# Patient Record
Sex: Female | Born: 1935 | Race: White | Hispanic: No
Health system: Southern US, Community
[De-identification: ages and names within clinical notes are randomized; demographics above are authoritative.]

---

## 2004-12-23 ENCOUNTER — Emergency Department: Payer: Self-pay | Admitting: Emergency Medicine

## 2005-02-25 ENCOUNTER — Encounter: Payer: Self-pay | Admitting: Cardiology

## 2005-03-07 ENCOUNTER — Observation Stay: Payer: Self-pay | Admitting: Internal Medicine

## 2005-03-07 ENCOUNTER — Other Ambulatory Visit: Payer: Self-pay

## 2005-03-22 ENCOUNTER — Encounter: Payer: Self-pay | Admitting: Cardiology

## 2005-04-22 ENCOUNTER — Encounter: Payer: Self-pay | Admitting: Cardiology

## 2005-05-23 ENCOUNTER — Encounter: Payer: Self-pay | Admitting: Cardiology

## 2005-06-23 ENCOUNTER — Encounter: Payer: Self-pay | Admitting: Internal Medicine

## 2005-07-08 ENCOUNTER — Ambulatory Visit: Payer: Self-pay | Admitting: Internal Medicine

## 2005-07-23 ENCOUNTER — Encounter: Payer: Self-pay | Admitting: Internal Medicine

## 2005-08-22 ENCOUNTER — Encounter: Payer: Self-pay | Admitting: Internal Medicine

## 2005-09-22 ENCOUNTER — Encounter: Payer: Self-pay | Admitting: Internal Medicine

## 2005-10-23 ENCOUNTER — Encounter: Payer: Self-pay | Admitting: Internal Medicine

## 2005-11-20 ENCOUNTER — Encounter: Payer: Self-pay | Admitting: Internal Medicine

## 2005-12-21 ENCOUNTER — Encounter: Payer: Self-pay | Admitting: Internal Medicine

## 2006-01-20 ENCOUNTER — Encounter: Payer: Self-pay | Admitting: Internal Medicine

## 2006-02-20 ENCOUNTER — Encounter: Payer: Self-pay | Admitting: Internal Medicine

## 2006-03-24 ENCOUNTER — Encounter: Payer: Self-pay | Admitting: Internal Medicine

## 2006-04-11 ENCOUNTER — Other Ambulatory Visit: Payer: Self-pay

## 2006-04-11 ENCOUNTER — Emergency Department: Payer: Self-pay | Admitting: Emergency Medicine

## 2006-04-22 ENCOUNTER — Encounter: Payer: Self-pay | Admitting: Internal Medicine

## 2006-05-23 ENCOUNTER — Encounter: Payer: Self-pay | Admitting: Internal Medicine

## 2006-06-22 ENCOUNTER — Encounter: Payer: Self-pay | Admitting: Internal Medicine

## 2006-07-23 ENCOUNTER — Encounter: Payer: Self-pay | Admitting: Internal Medicine

## 2006-08-22 ENCOUNTER — Encounter: Payer: Self-pay | Admitting: Internal Medicine

## 2006-09-22 ENCOUNTER — Encounter: Payer: Self-pay | Admitting: Internal Medicine

## 2006-10-23 ENCOUNTER — Encounter: Payer: Self-pay | Admitting: Internal Medicine

## 2006-11-21 ENCOUNTER — Encounter: Payer: Self-pay | Admitting: Internal Medicine

## 2006-12-22 ENCOUNTER — Encounter: Payer: Self-pay | Admitting: Internal Medicine

## 2007-01-21 ENCOUNTER — Encounter: Payer: Self-pay | Admitting: Internal Medicine

## 2007-02-21 ENCOUNTER — Encounter: Payer: Self-pay | Admitting: Internal Medicine

## 2007-03-23 ENCOUNTER — Encounter: Payer: Self-pay | Admitting: Internal Medicine

## 2007-04-23 ENCOUNTER — Encounter: Payer: Self-pay | Admitting: Internal Medicine

## 2007-05-24 ENCOUNTER — Encounter: Payer: Self-pay | Admitting: Internal Medicine

## 2007-09-09 ENCOUNTER — Ambulatory Visit: Payer: Self-pay | Admitting: Internal Medicine

## 2008-04-25 ENCOUNTER — Ambulatory Visit: Payer: Self-pay

## 2008-11-17 ENCOUNTER — Emergency Department: Payer: Self-pay | Admitting: Emergency Medicine

## 2009-07-13 ENCOUNTER — Emergency Department: Payer: Self-pay | Admitting: Unknown Physician Specialty

## 2010-01-18 ENCOUNTER — Inpatient Hospital Stay: Payer: Self-pay | Admitting: Internal Medicine

## 2010-01-30 ENCOUNTER — Inpatient Hospital Stay: Payer: Self-pay | Admitting: Internal Medicine

## 2010-12-25 IMAGING — CR DG CHEST 1V PORT
1 series · 1 of 1 positions shown · non-contrast
Comparison: none

REASON FOR EXAM: FEVER
COMMENTS:

[view not recorded]
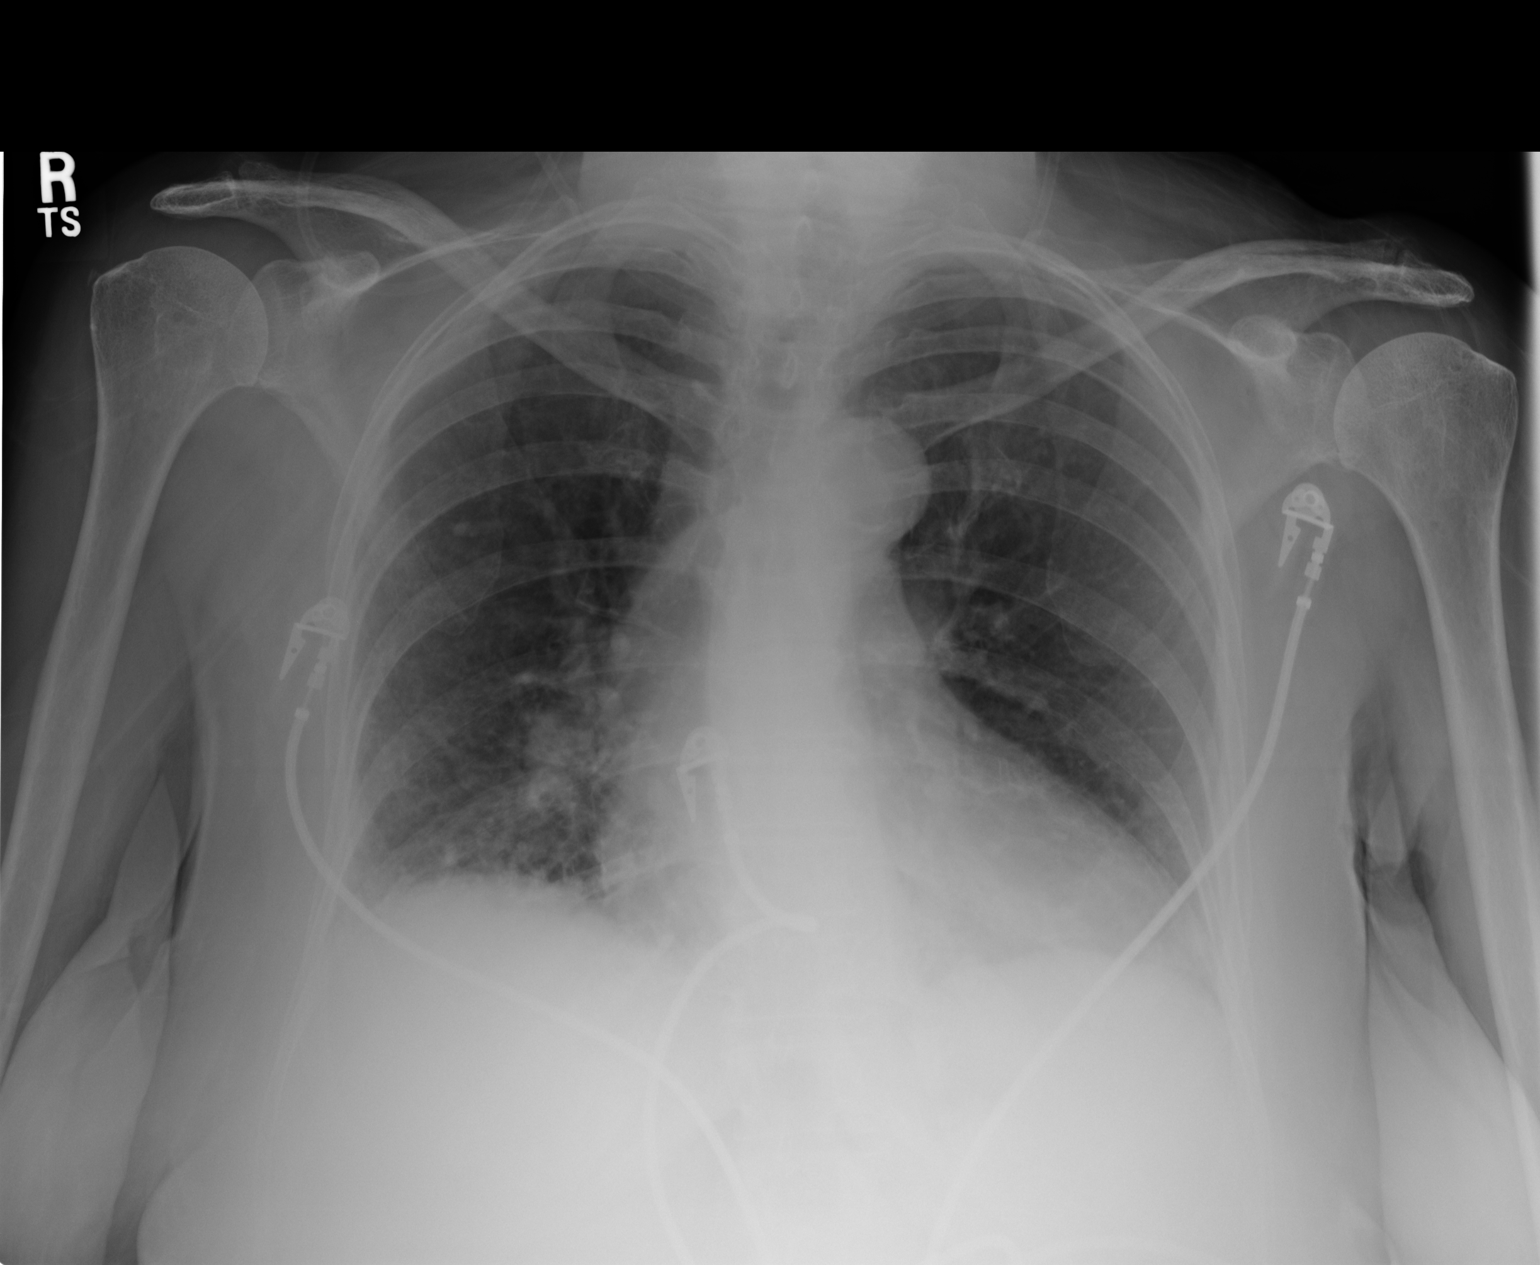

[1 of 1 positions shown; findings below may reference images not displayed]

PROCEDURE:     DXR - DXR PORTABLE CHEST SINGLE VIEW  - January 30, 2010  [DATE]

RESULT:     Frontal view of the chest is performed. Comparison is made to a
prior study dated 01/18/2010.

There is thickening of the interstitial markings and peribronchial cuffing.
An area of mild increased density projects within the right lung base. No
focal regions of consolidation are appreciated. The cardiac silhouette is
within the upper limits of normal. The visualized bony skeleton is
unremarkable.
IMPRESSION: Interstitial infiltrate, edematous versus nonedematous, with
likely a component of underlying pulmonary fibrosis.

## 2010-12-26 IMAGING — US US EXTREM LOW VENOUS BILAT
1 series · 17 of 24 positions shown · non-contrast
Comparison: none

REASON FOR EXAM: ro dvt
COMMENTS:

[Series 1: us extrem low venous bilat · 17 of 40 slices shown]
[im 1/40]
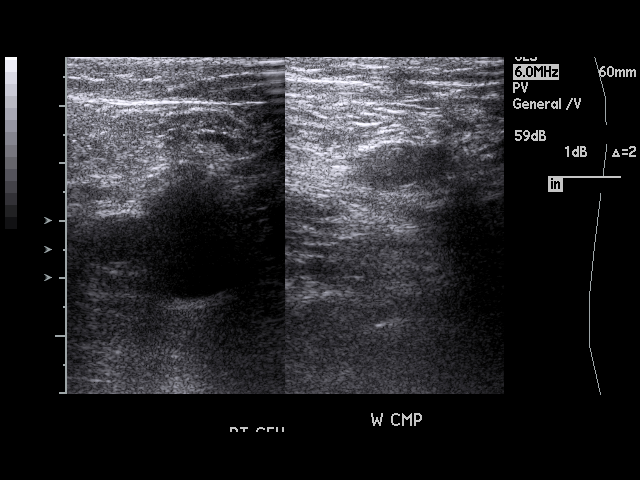
[im 4/40]
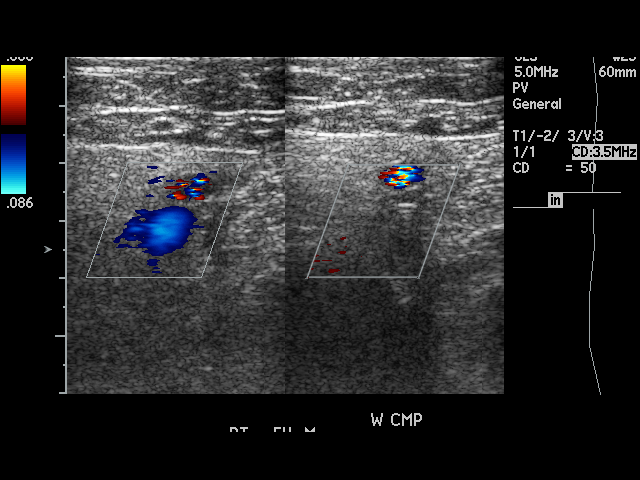
[im 6/40]
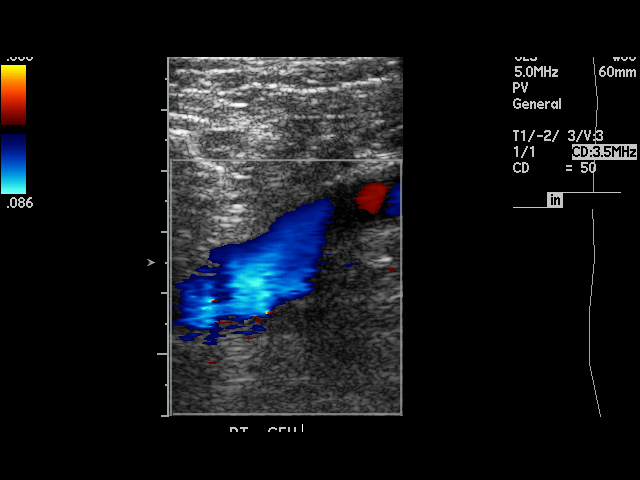
[im 7/40]
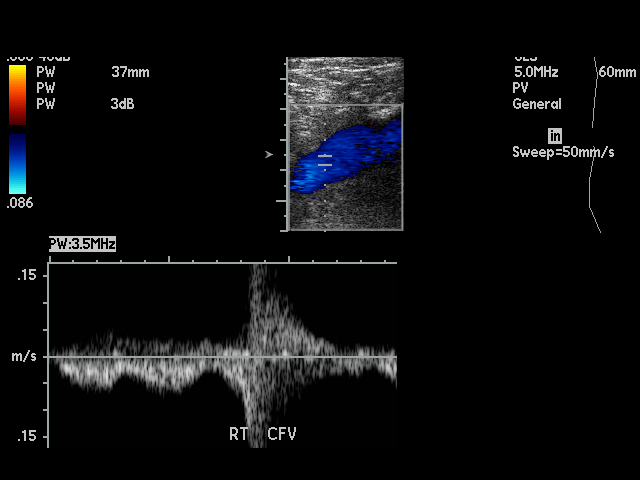
[im 11/40]
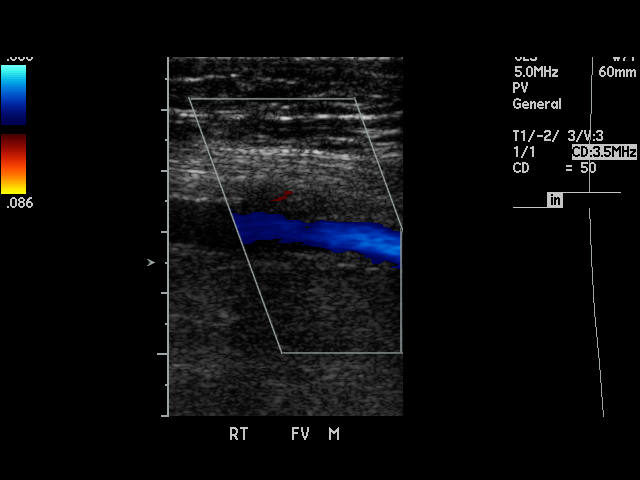
[im 12/40]
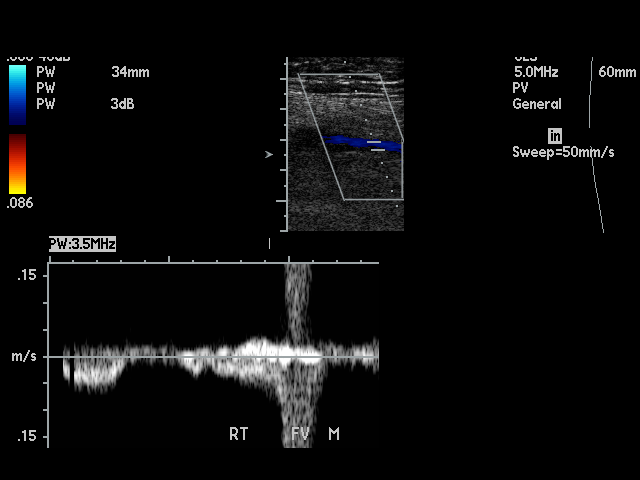
[im 16/40]
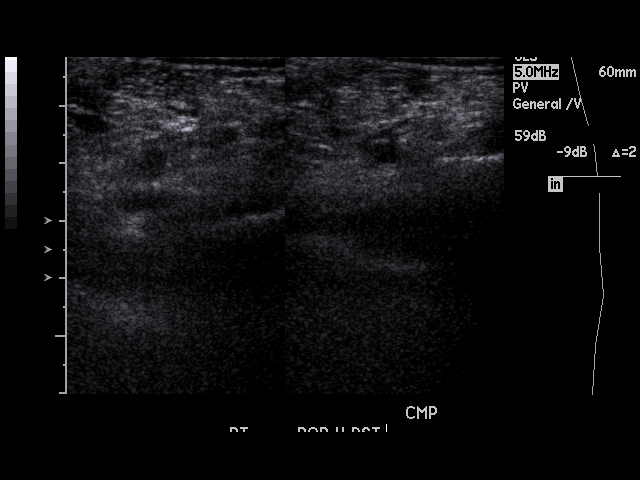
[im 17/40]
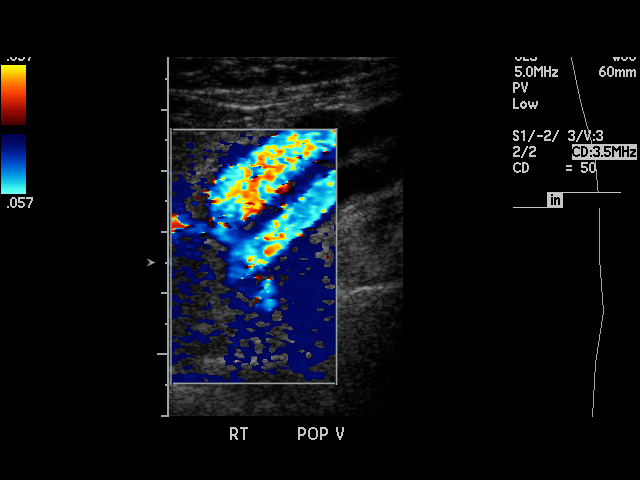
[im 21/40]
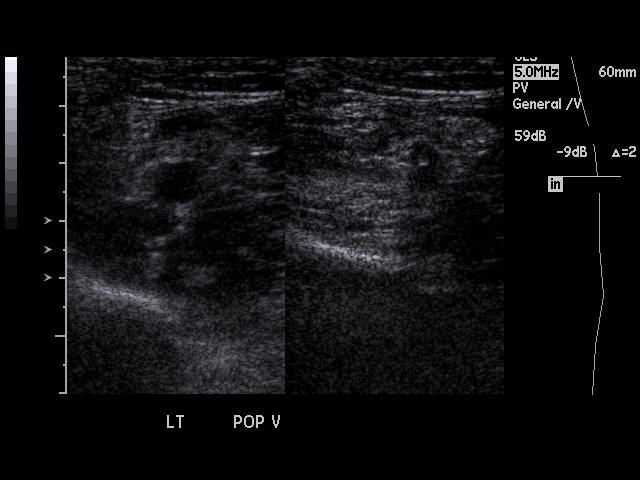
[im 23/40]
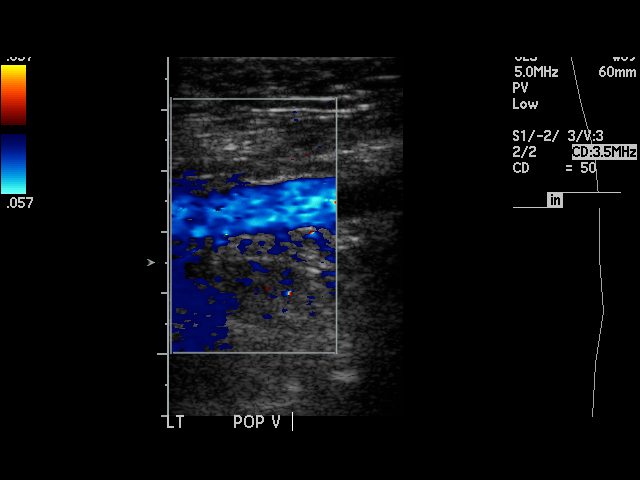
[im 24/40]
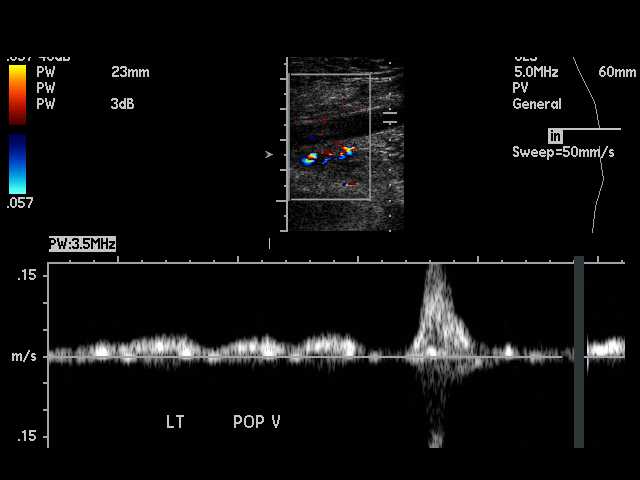
[im 28/40]
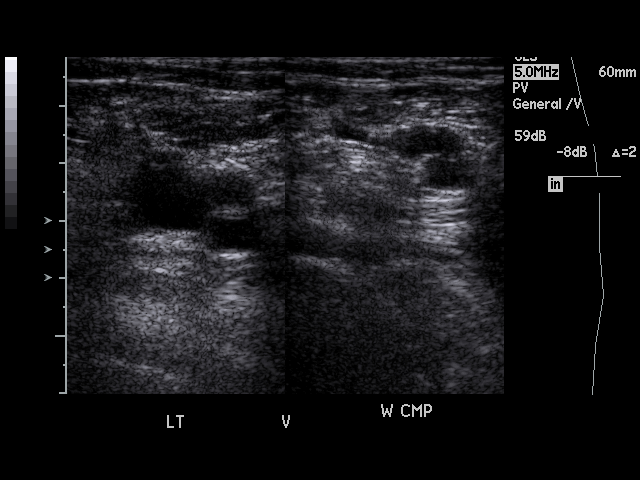
[im 29/40]
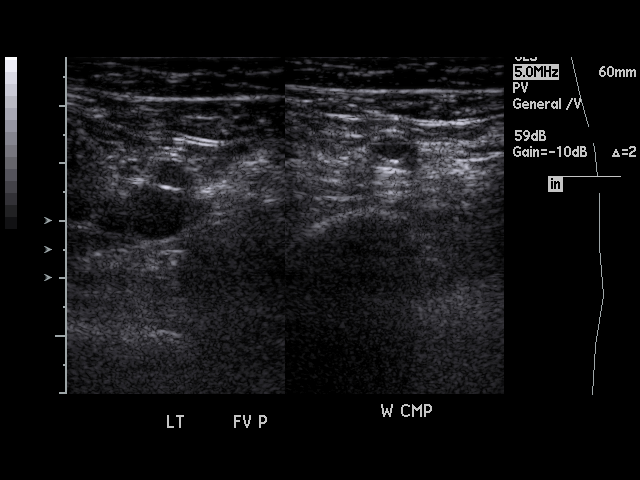
[im 33/40]
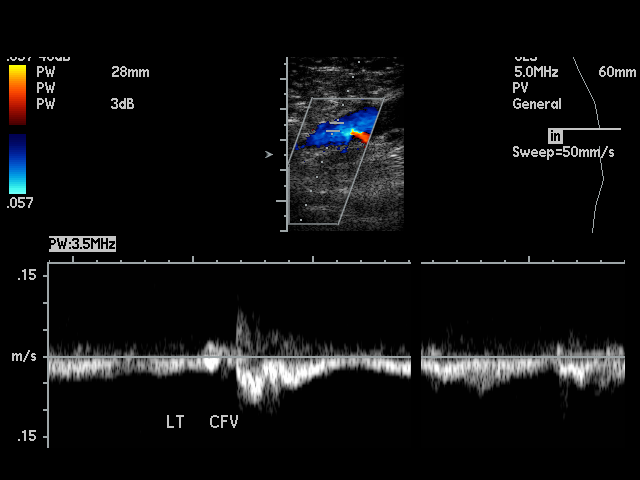
[im 34/40]
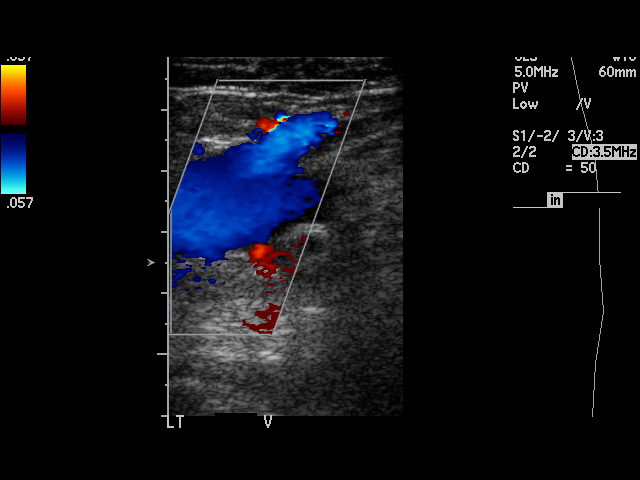
[im 36/40]
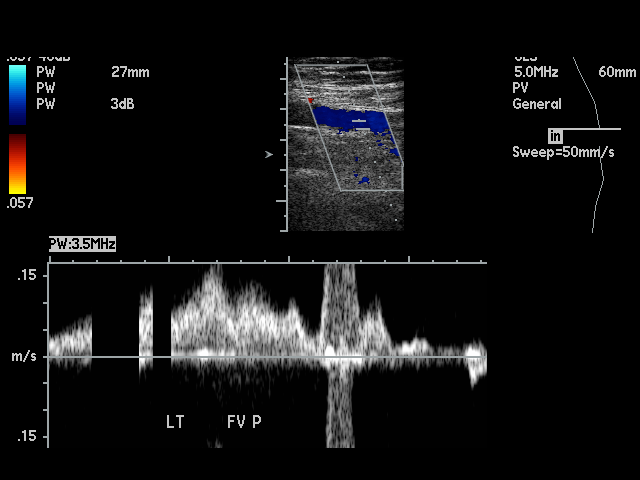
[im 40/40]
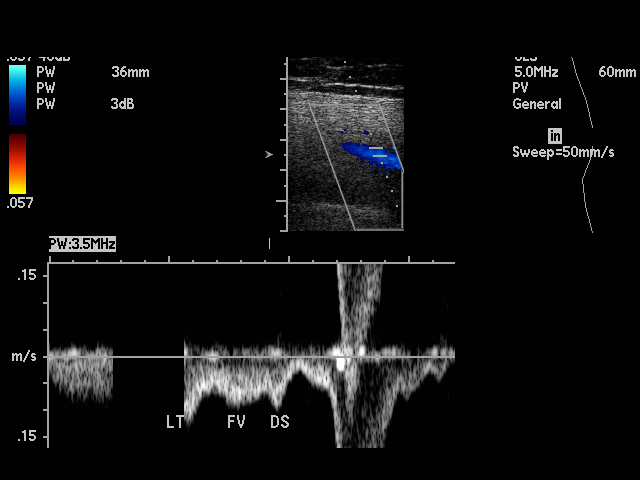

[17 of 24 positions shown; findings below may reference images not displayed]

PROCEDURE:     US  - US DOPPLER LOW EXTR BILATERAL  - January 31, 2010 [DATE]

RESULT:     The phasic, augmentation and Valsalva waveforms are normal in
appearance bilaterally. The femoral and popliteal veins bilaterally show
complete compressibility. Doppler examination shows no occlusion or evidence
of deep vein thrombosis.
IMPRESSION: No deep vein thrombosis is identified in either leg.

## 2011-11-01 ENCOUNTER — Emergency Department: Payer: Self-pay | Admitting: Internal Medicine

## 2012-04-21 ENCOUNTER — Emergency Department: Payer: Self-pay | Admitting: Emergency Medicine

## 2012-04-21 LAB — CBC
HCT: 33.1 % — ABNORMAL LOW (ref 35.0–47.0)
MCHC: 34.1 g/dL (ref 32.0–36.0)
Platelet: 173 10*3/uL (ref 150–440)
RDW: 13.9 % (ref 11.5–14.5)
WBC: 5.1 10*3/uL (ref 3.6–11.0)

## 2012-04-21 LAB — URINALYSIS, COMPLETE
Bilirubin,UR: NEGATIVE
Blood: NEGATIVE
Ketone: NEGATIVE
Ph: 5 (ref 4.5–8.0)
RBC,UR: 2 /HPF (ref 0–5)
Specific Gravity: 1.021 (ref 1.003–1.030)
Squamous Epithelial: <1
WBC UR: 86 /HPF (ref 0–5)

## 2012-04-21 LAB — COMPREHENSIVE METABOLIC PANEL
Albumin: 3 g/dL — ABNORMAL LOW (ref 3.4–5.0)
Alkaline Phosphatase: 101 U/L (ref 50–136)
BUN: 17 mg/dL (ref 7–18)
Calcium, Total: 8.4 mg/dL — ABNORMAL LOW (ref 8.5–10.1)
Co2: 24 mmol/L (ref 21–32)
EGFR (African American): >60
EGFR (Non-African Amer.): >60
Glucose: 112 mg/dL — ABNORMAL HIGH (ref 65–99)
Osmolality: 284 (ref 275–301)
Potassium: 4.2 mmol/L (ref 3.5–5.1)
SGPT (ALT): 13 U/L

## 2012-04-21 LAB — TROPONIN I: Troponin-I: <0.02 ng/mL

## 2012-12-21 ENCOUNTER — Ambulatory Visit: Payer: Self-pay | Admitting: Internal Medicine

## 2012-12-29 ENCOUNTER — Inpatient Hospital Stay: Payer: Self-pay | Admitting: Internal Medicine

## 2012-12-29 LAB — CBC
HCT: 35.7 % (ref 35.0–47.0)
HGB: 11.9 g/dL — ABNORMAL LOW (ref 12.0–16.0)
MCH: 30.2 pg (ref 26.0–34.0)
MCHC: 33.4 g/dL (ref 32.0–36.0)
MCV: 91 fL (ref 80–100)
Platelet: 147 10*3/uL — ABNORMAL LOW (ref 150–440)
RBC: 3.94 10*6/uL (ref 3.80–5.20)
RDW: 13.4 % (ref 11.5–14.5)
WBC: 7.6 10*3/uL (ref 3.6–11.0)

## 2012-12-29 LAB — COMPREHENSIVE METABOLIC PANEL
Albumin: 3.5 g/dL (ref 3.4–5.0)
Alkaline Phosphatase: 96 U/L (ref 50–136)
BUN: 17 mg/dL (ref 7–18)
Bilirubin,Total: 0.4 mg/dL (ref 0.2–1.0)
Chloride: 108 mmol/L — ABNORMAL HIGH (ref 98–107)
Creatinine: 0.88 mg/dL (ref 0.60–1.30)
EGFR (Non-African Amer.): 58 — ABNORMAL LOW
Potassium: 4.3 mmol/L (ref 3.5–5.1)
SGOT(AST): 17 U/L (ref 15–37)
Sodium: 141 mmol/L (ref 136–145)

## 2012-12-29 LAB — URINALYSIS, COMPLETE
Glucose,UR: NEGATIVE mg/dL (ref 0–75)
Hyaline Cast: 74
Ketone: NEGATIVE
Squamous Epithelial: 20

## 2012-12-29 LAB — TROPONIN I: Troponin-I: <0.02 ng/mL

## 2012-12-29 LAB — CK TOTAL AND CKMB (NOT AT ARMC): CK-MB: <0.5 ng/mL — ABNORMAL LOW (ref 0.5–3.6)

## 2012-12-30 LAB — CBC WITH DIFFERENTIAL/PLATELET
Basophil #: 0 10*3/uL (ref 0.0–0.1)
Basophil %: 0.4 %
Eosinophil #: 0.1 10*3/uL (ref 0.0–0.7)
Eosinophil %: 1.3 %
HCT: 31.2 % — ABNORMAL LOW (ref 35.0–47.0)
MCV: 89 fL (ref 80–100)
Monocyte #: 0.6 x10 3/mm (ref 0.2–0.9)
Neutrophil %: 64.9 %
Platelet: 125 10*3/uL — ABNORMAL LOW (ref 150–440)
RBC: 3.51 10*6/uL — ABNORMAL LOW (ref 3.80–5.20)

## 2012-12-30 LAB — BASIC METABOLIC PANEL
Calcium, Total: 8.1 mg/dL — ABNORMAL LOW (ref 8.5–10.1)
Chloride: 109 mmol/L — ABNORMAL HIGH (ref 98–107)
Creatinine: 0.87 mg/dL (ref 0.60–1.30)
EGFR (Non-African Amer.): 59 — ABNORMAL LOW
Glucose: 94 mg/dL (ref 65–99)
Osmolality: 281 (ref 275–301)
Potassium: 3.7 mmol/L (ref 3.5–5.1)
Sodium: 141 mmol/L (ref 136–145)

## 2012-12-30 LAB — MAGNESIUM: Magnesium: 1.5 mg/dL — ABNORMAL LOW

## 2013-01-01 LAB — CBC WITH DIFFERENTIAL/PLATELET
Basophil #: 0 10*3/uL (ref 0.0–0.1)
Eosinophil #: 0.1 10*3/uL (ref 0.0–0.7)
HCT: 34.5 % — ABNORMAL LOW (ref 35.0–47.0)
Lymphocyte %: 22.9 %
MCH: 29.8 pg (ref 26.0–34.0)
MCHC: 33.6 g/dL (ref 32.0–36.0)
MCV: 89 fL (ref 80–100)
Monocyte #: 0.6 x10 3/mm (ref 0.2–0.9)
Neutrophil #: 4.4 10*3/uL (ref 1.4–6.5)
Neutrophil %: 65.5 %
Platelet: 143 10*3/uL — ABNORMAL LOW (ref 150–440)
RDW: 13.3 % (ref 11.5–14.5)

## 2013-01-02 LAB — CBC WITH DIFFERENTIAL/PLATELET
Basophil #: 0 10*3/uL (ref 0.0–0.1)
Basophil %: 0.6 %
Eosinophil #: 0.2 10*3/uL (ref 0.0–0.7)
HGB: 10.5 g/dL — ABNORMAL LOW (ref 12.0–16.0)
Lymphocyte #: 1.2 10*3/uL (ref 1.0–3.6)
Lymphocyte %: 23.1 %
MCH: 29.4 pg (ref 26.0–34.0)
MCHC: 33.1 g/dL (ref 32.0–36.0)
Monocyte #: 0.5 x10 3/mm (ref 0.2–0.9)
Monocyte %: 9.8 %
Neutrophil #: 3.3 10*3/uL (ref 1.4–6.5)
Neutrophil %: 63 %
WBC: 5.2 10*3/uL (ref 3.6–11.0)

## 2013-01-02 LAB — BASIC METABOLIC PANEL
BUN: 9 mg/dL (ref 7–18)
Calcium, Total: 8 mg/dL — ABNORMAL LOW (ref 8.5–10.1)
Chloride: 111 mmol/L — ABNORMAL HIGH (ref 98–107)
Co2: 26 mmol/L (ref 21–32)
Glucose: 106 mg/dL — ABNORMAL HIGH (ref 65–99)
Osmolality: 282 (ref 275–301)

## 2013-01-03 LAB — BASIC METABOLIC PANEL
BUN: 7 mg/dL (ref 7–18)
Calcium, Total: 7.6 mg/dL — ABNORMAL LOW (ref 8.5–10.1)
Chloride: 114 mmol/L — ABNORMAL HIGH (ref 98–107)
Co2: 25 mmol/L (ref 21–32)
Creatinine: 0.63 mg/dL (ref 0.60–1.30)
EGFR (African American): >60
Glucose: 98 mg/dL (ref 65–99)
Osmolality: 285 (ref 275–301)
Potassium: 3.4 mmol/L — ABNORMAL LOW (ref 3.5–5.1)

## 2013-01-03 LAB — WBCS, STOOL

## 2013-01-03 LAB — VANCOMYCIN, TROUGH: Vancomycin, Trough: 6 ug/mL — ABNORMAL LOW (ref 10–20)

## 2013-01-04 LAB — BASIC METABOLIC PANEL
BUN: 11 mg/dL (ref 7–18)
Chloride: 115 mmol/L — ABNORMAL HIGH (ref 98–107)
Co2: 24 mmol/L (ref 21–32)
Creatinine: 0.64 mg/dL (ref 0.60–1.30)
EGFR (Non-African Amer.): >60
Glucose: 102 mg/dL — ABNORMAL HIGH (ref 65–99)
Osmolality: 288 (ref 275–301)
Sodium: 145 mmol/L (ref 136–145)

## 2013-01-04 LAB — CBC WITH DIFFERENTIAL/PLATELET
Basophil %: 0.5 %
Eosinophil %: 4.1 %
HGB: 9 g/dL — ABNORMAL LOW (ref 12.0–16.0)
Lymphocyte #: 1.2 10*3/uL (ref 1.0–3.6)
Lymphocyte %: 24.4 %
MCV: 88 fL (ref 80–100)
Monocyte #: 0.5 x10 3/mm (ref 0.2–0.9)
Monocyte %: 9.4 %
Neutrophil %: 61.6 %
Platelet: 140 10*3/uL — ABNORMAL LOW (ref 150–440)
RBC: 3.01 10*6/uL — ABNORMAL LOW (ref 3.80–5.20)
RDW: 13.3 % (ref 11.5–14.5)
WBC: 5.1 10*3/uL (ref 3.6–11.0)

## 2013-01-04 LAB — MAGNESIUM: Magnesium: 1.6 mg/dL — ABNORMAL LOW

## 2013-01-04 LAB — CULTURE, BLOOD (SINGLE)

## 2013-01-05 LAB — COMPREHENSIVE METABOLIC PANEL
Albumin: 2.4 g/dL — ABNORMAL LOW (ref 3.4–5.0)
Alkaline Phosphatase: 76 U/L (ref 50–136)
BUN: 7 mg/dL (ref 7–18)
Bilirubin,Total: 0.3 mg/dL (ref 0.2–1.0)
Co2: 26 mmol/L (ref 21–32)
Creatinine: 0.68 mg/dL (ref 0.60–1.30)
Glucose: 106 mg/dL — ABNORMAL HIGH (ref 65–99)
Osmolality: 282 (ref 275–301)
Potassium: 3.8 mmol/L (ref 3.5–5.1)
SGOT(AST): 19 U/L (ref 15–37)
SGPT (ALT): 20 U/L (ref 12–78)
Sodium: 142 mmol/L (ref 136–145)
Total Protein: 5.6 g/dL — ABNORMAL LOW (ref 6.4–8.2)

## 2013-01-05 LAB — CBC WITH DIFFERENTIAL/PLATELET
Basophil #: 0 10*3/uL (ref 0.0–0.1)
Eosinophil #: 0.2 10*3/uL (ref 0.0–0.7)
HCT: 28.8 % — ABNORMAL LOW (ref 35.0–47.0)
MCH: 30 pg (ref 26.0–34.0)
MCHC: 34.1 g/dL (ref 32.0–36.0)
MCV: 88 fL (ref 80–100)
Monocyte %: 6.9 %
WBC: 6.7 10*3/uL (ref 3.6–11.0)

## 2013-01-12 DIAGNOSIS — K219 Gastro-esophageal reflux disease without esophagitis: Secondary | ICD-10-CM

## 2013-01-12 DIAGNOSIS — I10 Essential (primary) hypertension: Secondary | ICD-10-CM

## 2013-01-12 DIAGNOSIS — G309 Alzheimer's disease, unspecified: Secondary | ICD-10-CM

## 2013-01-12 DIAGNOSIS — E785 Hyperlipidemia, unspecified: Secondary | ICD-10-CM

## 2013-01-12 DIAGNOSIS — N390 Urinary tract infection, site not specified: Secondary | ICD-10-CM

## 2013-01-12 DIAGNOSIS — F0280 Dementia in other diseases classified elsewhere without behavioral disturbance: Secondary | ICD-10-CM

## 2013-01-12 DIAGNOSIS — F329 Major depressive disorder, single episode, unspecified: Secondary | ICD-10-CM

## 2013-01-17 DIAGNOSIS — H5316 Psychophysical visual disturbances: Secondary | ICD-10-CM

## 2013-01-20 ENCOUNTER — Ambulatory Visit: Payer: Self-pay | Admitting: Internal Medicine

## 2013-03-22 ENCOUNTER — Ambulatory Visit: Payer: Self-pay | Admitting: Family Medicine

## 2013-05-09 ENCOUNTER — Emergency Department: Payer: Self-pay | Admitting: Emergency Medicine

## 2013-05-09 LAB — CBC
HCT: 32.7 % — ABNORMAL LOW (ref 35.0–47.0)
HGB: 11.2 g/dL — ABNORMAL LOW (ref 12.0–16.0)
MCH: 30.4 pg (ref 26.0–34.0)
MCHC: 34.2 g/dL (ref 32.0–36.0)
MCV: 89 fL (ref 80–100)
Platelet: 157 10*3/uL (ref 150–440)
RBC: 3.67 10*6/uL — ABNORMAL LOW (ref 3.80–5.20)

## 2013-06-14 ENCOUNTER — Emergency Department: Payer: Self-pay | Admitting: Emergency Medicine

## 2013-06-24 ENCOUNTER — Inpatient Hospital Stay: Payer: Self-pay | Admitting: Internal Medicine

## 2013-06-24 LAB — URINALYSIS, COMPLETE
Glucose,UR: NEGATIVE mg/dL (ref 0–75)
Ketone: NEGATIVE
Leukocyte Esterase: NEGATIVE
Nitrite: POSITIVE
Ph: 5 (ref 4.5–8.0)
Protein: NEGATIVE
Squamous Epithelial: 3
WBC UR: 3 /HPF (ref 0–5)

## 2013-06-24 LAB — BASIC METABOLIC PANEL
Anion Gap: 6 — ABNORMAL LOW (ref 7–16)
Anion Gap: 8 (ref 7–16)
BUN: 19 mg/dL — ABNORMAL HIGH (ref 7–18)
Calcium, Total: 9 mg/dL (ref 8.5–10.1)
Chloride: 112 mmol/L — ABNORMAL HIGH (ref 98–107)
Co2: 25 mmol/L (ref 21–32)
Co2: 28 mmol/L (ref 21–32)
Creatinine: 0.93 mg/dL (ref 0.60–1.30)
EGFR (African American): >60
EGFR (Non-African Amer.): 54 — ABNORMAL LOW
EGFR (Non-African Amer.): >60
Glucose: 156 mg/dL — ABNORMAL HIGH (ref 65–99)
Osmolality: 294 (ref 275–301)
Potassium: 4.2 mmol/L (ref 3.5–5.1)
Potassium: 4.6 mmol/L (ref 3.5–5.1)
Sodium: 145 mmol/L (ref 136–145)

## 2013-06-24 LAB — CBC WITH DIFFERENTIAL/PLATELET
Basophil %: 0.2 %
Eosinophil #: 0.1 10*3/uL (ref 0.0–0.7)
Eosinophil %: 0.6 %
HGB: 11.8 g/dL — ABNORMAL LOW (ref 12.0–16.0)
Lymphocyte %: 9.1 %
MCH: 30.3 pg (ref 26.0–34.0)
MCHC: 33 g/dL (ref 32.0–36.0)
MCV: 92 fL (ref 80–100)
Monocyte %: 6.1 %
Neutrophil #: 10.1 10*3/uL — ABNORMAL HIGH (ref 1.4–6.5)
Neutrophil %: 84 %
Platelet: 157 10*3/uL (ref 150–440)
RDW: 13.8 % (ref 11.5–14.5)
WBC: 12 10*3/uL — ABNORMAL HIGH (ref 3.6–11.0)

## 2013-06-24 LAB — CBC
HGB: 12.1 g/dL (ref 12.0–16.0)
MCH: 30.5 pg (ref 26.0–34.0)
MCHC: 33.4 g/dL (ref 32.0–36.0)
MCV: 92 fL (ref 80–100)

## 2013-06-24 LAB — PROTIME-INR: INR: 0.9

## 2013-06-25 LAB — BASIC METABOLIC PANEL
BUN: 16 mg/dL (ref 7–18)
Calcium, Total: 8.2 mg/dL — ABNORMAL LOW (ref 8.5–10.1)
Chloride: 114 mmol/L — ABNORMAL HIGH (ref 98–107)
Co2: 22 mmol/L (ref 21–32)
EGFR (African American): 54 — ABNORMAL LOW
Osmolality: 289 (ref 275–301)

## 2013-06-25 LAB — PLATELET COUNT: Platelet: 102 10*3/uL — ABNORMAL LOW (ref 150–440)

## 2013-06-25 LAB — HEMOGLOBIN: HGB: 11.2 g/dL — ABNORMAL LOW (ref 12.0–16.0)

## 2013-06-26 LAB — BASIC METABOLIC PANEL
Calcium, Total: 8.2 mg/dL — ABNORMAL LOW (ref 8.5–10.1)
Chloride: 114 mmol/L — ABNORMAL HIGH (ref 98–107)
Creatinine: 0.96 mg/dL (ref 0.60–1.30)
EGFR (Non-African Amer.): 52 — ABNORMAL LOW
Osmolality: 292 (ref 275–301)
Sodium: 145 mmol/L (ref 136–145)

## 2013-06-26 LAB — CBC WITH DIFFERENTIAL/PLATELET
Basophil #: 0 10*3/uL (ref 0.0–0.1)
Basophil %: 0.7 %
Eosinophil #: 0.4 10*3/uL (ref 0.0–0.7)
Eosinophil %: 6.6 %
HCT: 21.4 % — ABNORMAL LOW (ref 35.0–47.0)
Lymphocyte #: 1.4 10*3/uL (ref 1.0–3.6)
Lymphocyte %: 23 %
MCHC: 33.9 g/dL (ref 32.0–36.0)
Neutrophil %: 62.2 %
Platelet: 111 10*3/uL — ABNORMAL LOW (ref 150–440)
RDW: 14.1 % (ref 11.5–14.5)

## 2013-06-27 LAB — BASIC METABOLIC PANEL
Anion Gap: 6 — ABNORMAL LOW (ref 7–16)
BUN: 15 mg/dL (ref 7–18)
Chloride: 114 mmol/L — ABNORMAL HIGH (ref 98–107)
Co2: 23 mmol/L (ref 21–32)
Osmolality: 288 (ref 275–301)
Potassium: 3.9 mmol/L (ref 3.5–5.1)

## 2013-06-27 LAB — CBC WITH DIFFERENTIAL/PLATELET
Basophil %: 0.7 %
Eosinophil #: 0.5 10*3/uL (ref 0.0–0.7)
Eosinophil %: 5.8 %
HCT: 20.6 % — ABNORMAL LOW (ref 35.0–47.0)
Lymphocyte #: 1.6 10*3/uL (ref 1.0–3.6)
MCH: 30.7 pg (ref 26.0–34.0)
MCHC: 34.3 g/dL (ref 32.0–36.0)
Monocyte #: 0.5 x10 3/mm (ref 0.2–0.9)
Monocyte %: 6.3 %
Neutrophil %: 66.9 %
Platelet: 123 10*3/uL — ABNORMAL LOW (ref 150–440)
WBC: 7.9 10*3/uL (ref 3.6–11.0)

## 2013-06-28 LAB — CBC WITH DIFFERENTIAL/PLATELET
Basophil #: 0.1 10*3/uL (ref 0.0–0.1)
Basophil %: 0.6 %
Eosinophil #: 0.9 10*3/uL — ABNORMAL HIGH (ref 0.0–0.7)
Eosinophil %: 10.9 %
HCT: 23.3 % — ABNORMAL LOW (ref 35.0–47.0)
HGB: 8.2 g/dL — ABNORMAL LOW (ref 12.0–16.0)
Lymphocyte #: 1.6 10*3/uL (ref 1.0–3.6)
Lymphocyte %: 19.3 %
MCH: 30.8 pg (ref 26.0–34.0)
MCHC: 35.1 g/dL (ref 32.0–36.0)
Monocyte #: 0.5 x10 3/mm (ref 0.2–0.9)
Monocyte %: 6.1 %
Neutrophil #: 5.4 10*3/uL (ref 1.4–6.5)
Platelet: 130 10*3/uL — ABNORMAL LOW (ref 150–440)
RBC: 2.65 10*6/uL — ABNORMAL LOW (ref 3.80–5.20)

## 2013-06-28 LAB — URINALYSIS, COMPLETE
Nitrite: NEGATIVE
RBC,UR: 3 /HPF (ref 0–5)
WBC UR: 11 /HPF (ref 0–5)

## 2013-06-29 LAB — COMPREHENSIVE METABOLIC PANEL
Albumin: 2.1 g/dL — ABNORMAL LOW (ref 3.4–5.0)
Alkaline Phosphatase: 115 U/L (ref 50–136)
Anion Gap: 6 — ABNORMAL LOW (ref 7–16)
BUN: 13 mg/dL (ref 7–18)
Bilirubin,Total: 0.5 mg/dL (ref 0.2–1.0)
Chloride: 108 mmol/L — ABNORMAL HIGH (ref 98–107)
Co2: 25 mmol/L (ref 21–32)
Creatinine: 0.83 mg/dL (ref 0.60–1.30)
EGFR (African American): >60
EGFR (Non-African Amer.): >60
Potassium: 4.1 mmol/L (ref 3.5–5.1)
SGOT(AST): 44 U/L — ABNORMAL HIGH (ref 15–37)
SGPT (ALT): 70 U/L (ref 12–78)
Total Protein: 6 g/dL — ABNORMAL LOW (ref 6.4–8.2)

## 2013-06-29 LAB — CBC WITH DIFFERENTIAL/PLATELET
Basophil #: 0 10*3/uL (ref 0.0–0.1)
Eosinophil #: 0.8 10*3/uL — ABNORMAL HIGH (ref 0.0–0.7)
HGB: 9 g/dL — ABNORMAL LOW (ref 12.0–16.0)
Lymphocyte #: 1.2 10*3/uL (ref 1.0–3.6)
Lymphocyte %: 17.1 %
MCHC: 34.5 g/dL (ref 32.0–36.0)
Monocyte #: 0.5 x10 3/mm (ref 0.2–0.9)
Monocyte %: 7 %
Neutrophil #: 4.6 10*3/uL (ref 1.4–6.5)
Neutrophil %: 64.7 %
RDW: 14 % (ref 11.5–14.5)
WBC: 7.1 10*3/uL (ref 3.6–11.0)

## 2013-06-30 LAB — URINE CULTURE

## 2013-07-01 DIAGNOSIS — E785 Hyperlipidemia, unspecified: Secondary | ICD-10-CM

## 2013-07-01 DIAGNOSIS — K219 Gastro-esophageal reflux disease without esophagitis: Secondary | ICD-10-CM

## 2013-07-01 DIAGNOSIS — F329 Major depressive disorder, single episode, unspecified: Secondary | ICD-10-CM

## 2013-07-01 DIAGNOSIS — F0280 Dementia in other diseases classified elsewhere without behavioral disturbance: Secondary | ICD-10-CM

## 2013-07-01 DIAGNOSIS — G309 Alzheimer's disease, unspecified: Secondary | ICD-10-CM

## 2013-07-01 DIAGNOSIS — I252 Old myocardial infarction: Secondary | ICD-10-CM

## 2013-07-01 LAB — PATHOLOGY REPORT

## 2013-07-03 LAB — CULTURE, BLOOD (SINGLE)

## 2013-07-08 DIAGNOSIS — D649 Anemia, unspecified: Secondary | ICD-10-CM

## 2013-07-12 DIAGNOSIS — R21 Rash and other nonspecific skin eruption: Secondary | ICD-10-CM

## 2014-04-22 DEATH — deceased

## 2014-05-23 IMAGING — CR DG CHEST 2V
1 series · 2 of 2 positions shown · non-contrast
Comparison: none

REASON FOR EXAM: postop fevers,congestion
COMMENTS:

[Series 1: ap · 0.17mm/px · 2 of 2 slices shown]
[im 1/2]
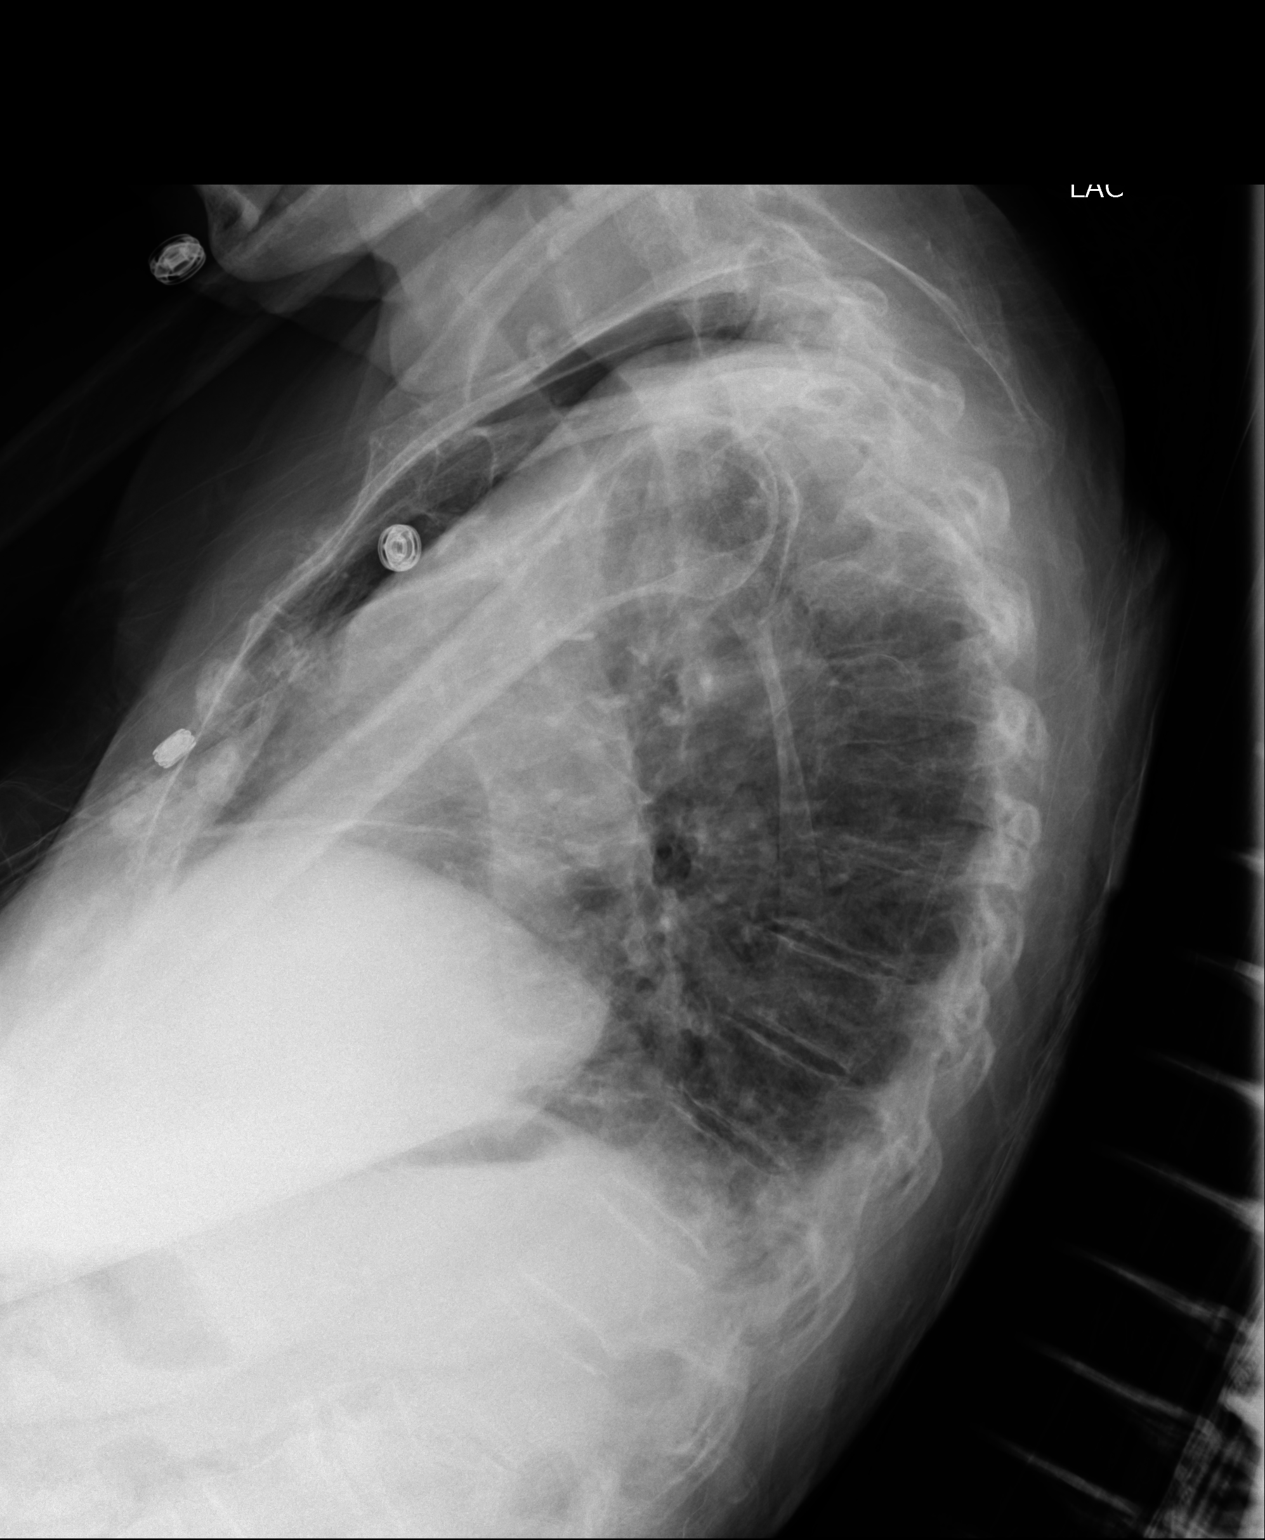
[im 2/2]
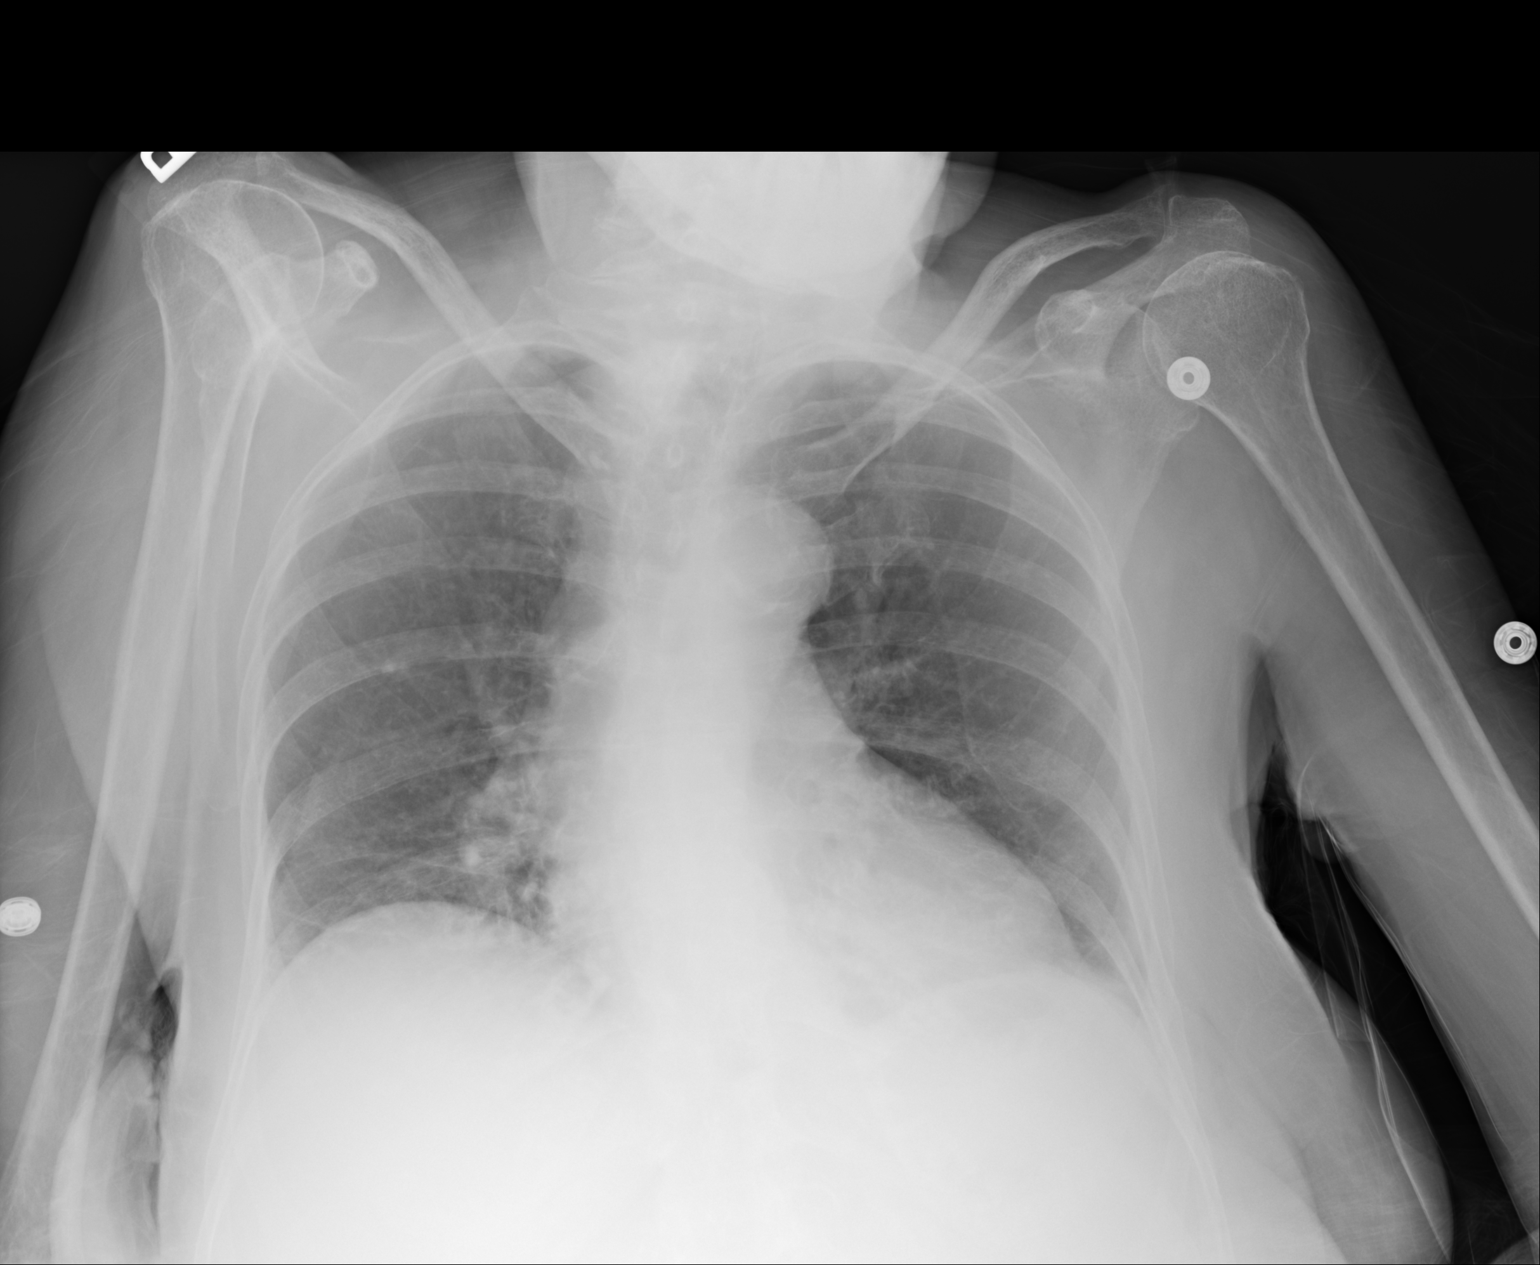

[2 of 2 positions shown; findings below may reference images not displayed]

PROCEDURE:     DXR - DXR CHEST PA (OR AP) AND LATERAL  - June 28, 2013  [DATE]

RESULT:     Trace bilateral effusions are present. Heart is at the upper
limits of normal in size. There is hypoinflation. There is no consolidation.
There is right lung base atelectasis. There is no pneumothorax or discrete
mass.
IMPRESSION: Trace pleural effusions with a small right lung base areas
of atelectasis.

[REDACTED]

## 2015-01-12 NOTE — H&P (Signed)
PATIENT NAME:  Tanya Shields, Tanya Shields MR#:  [id number] DATE OF BIRTH:  11/24/1920  DATE OF ADMISSION:  12/29/2012  REFERRING PHYSICIAN: Suella Broad, MD  PRIMARY CARE PHYSICIAN: Bethann Punches, MD    REASON FOR ADMISSION: Altered mental status.   HISTORY OF PRESENT ILLNESS:  The patient is an 81 year old female with history of hypertension, hyperlipidemia, coronary artery disease, depression, osteoarthritis, Alzheimer's, GERD, irritable bowel syndrome with multiple episodes of diarrhea very frequently, who comes with a history of being in her normal state of health up until this morning. The patient has a baseline where she is very low functioning, not very well for the most. She answers questions yes and no, and her daughter seems to feel like she understands whenever they are having a conversation, although she does not necessarily talk back. She very randomly says full phrases, but it is very unusual. This morning, she was having her hair done on the assisted living facility. The person who was doing her hair went down to grab some equipment, and when she came back 5 minutes after she was slumped down on the chair with her head down. The patient was transferred to the ER via EMS where she was evaluated. On her evaluation, the patient was very bradycardic with a heart rate in the 40s.  Her temperature was low down to 96, and her oxygen saturation was 97 to 99% on room air. Her respirations were normal in the 16s and 18s, and her  blood pressure has been stable.  The only thing showing up so far on her labs is a possible urinary tract infection. As per her daughter, the patient has been having episodes of fainting which have been increasing in frequency of lately. She is not eating much, not drinking much. She has very low functioning, and she has multiple episodes of diarrhea on and off through the day; and this is chronic due to severe irritable bowel syndrome, and in the past she also has had ischemic bowel. At this  moment, it does not seem like her diarrhea is playing any roles over here. She has not had any significant diarrhea lately.   The patient is admitted for hospitalization, treatment of a possible UTI.  As per conversation with the daughter, she does not want any heroic measures. She is a DNR. She does not want to put her on the critical care unit or treat the severe hypotension or things of that sense.   REVIEW OF SYSTEMS: A 12-system review of systems was unable to be obtained due to altered mental status.  As per her daughter, the patient has diarrhea on and off.  She has not had any fever, but she was really cold.  She has not had any upper respiratory symptoms. She has been fainting lately. This has been happening for several years already.   PAST MEDICAL HISTORY: Unable to obtain all the review of systems due to altered mental status.   PAST MEDICAL HISTORY: 1. Hyperlipidemia.  2. Coronary artery disease, is status post myocardial infarction.  3. Right coronary artery stent.  4. Hypertension.  5. Osteoarthritis.  6. Depression.  7. GERD. 8. Alzheimer's dementia.  9. Vitamin B12 deficiency.  10. Irritable bowel syndrome.  11. History of ischemic bowel syndrome.  12. History of chronic diarrhea on and off.  13. History of syncope.  14. Macular degeneration.  15. Fibroids.   ALLERGIES: SULFAS AND PENICILLIN.   PAST SURGICAL HISTORY:  1. A stent of the right coronary artery.  2. EGD.  3. D and C x 2.  4. Appendectomy.  5. Total abdominal hysterectomy.   FAMILY HISTORY: Positive for kidney disease in her father, and her mother had a CVA.   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 She used to work at [employer]. No smoking, no drinking, no drugs. She is a widow. She lives in an assisted living facility.   CODE STATUS:  She has a DNR.   CURRENT MEDICATIONS: Per nursing home medication list: Namenda 10 mg once daily, vitamin B12 1000 mcg  once daily, aspirin 81 mg once daily, Celexa 20 mg once daily, Exelon 3 mg capsule twice daily, metoprolol 12.5 mg twice daily, simvastatin 20 mg once daily, Prilosec 20 mg once daily, Preparation H as needed.   PHYSICAL EXAMINATION: VITAL SIGNS: Blood pressure 137/57, pulse 57, now is 60, respirations 16, temperature 96, oxygen saturation 99% on room air.  GENERAL: The patient is obtunded, unresponsive to voice.  She moved and fights whenever she is getting a Foley catheter or whenever I try to examine her. She moves four extremities spontaneously.  She withdraws to pain.  HEENT: Her pupils are equal and reactive. Extraocular movements unable to evaluate. She does not tract. She fights whenever we try to open her mouth.  Her mucosa are very dry. Anicteric sclerae. Pink conjunctivae. No oral lesions. No thrush.  NECK: Supple. No JVD. No thyromegaly. No adenopathy. No carotid bruits. No rigidity. No masses.  CARDIOVASCULAR: Regular rate and rhythm. No murmurs, rubs or gallops.  The patient is bradycardic in the 50s. No displacement of PMI.  LUNGS: Clear without any wheezing or crepitus. No use of accessory muscles. No dullness to percussion.  ABDOMEN: Soft, nontender, nondistended. No hepatosplenomegaly. No masses. Bowel sounds are positive.  GENITAL: Negative for external lesions. There is stool on her diaper.  It is semi-liquid. No blood.  EXTREMITIES: No edema, no cyanosis, no clubbing. Cold, clammy extremities, pulses +2. Capillary refill less than 3.  SKIN: Without any significant rashes or petechiae. No erythema of the lower extremities or around her groin.  NEUROLOGIC: The patient is obtunded, she moves four extremities whenever stimulated but does not do it spontaneously.  She withdraws to pain.  Unable to evaluate cranial nerves, but there is no significant unilateral weakness that I can tell. Apparently the patient was having some decreased use of the left side whenever she was seen first by  Dr. Ladona Ridgelaylor, but at this moment, again, she moves all four extremities.  PSYCHIATRIC: Mood:  The patient is obtunded, unable to assess affect. The patient is not verbally responsive.  LYMPHATIC: Negative for lymphadenopathy in the neck, supraclavicular areas or groin.  MUSCULOSKELETAL: No significant joint effusions or joint erythema.    LABORATORY AND RADIOLOGICAL DATA:  Glucose 91, BUN 17, creatinine 0.88, chloride is elevated at 108, calcium 8.6. LFTs within normal limits. Troponin 0.02. White count is 7.6, hemoglobin is 11.9, platelets 147.  Urinalysis has 140 white blood cells, and this is from Foley catheter, red blood cells 49, positive nitrate, +2 leukocyte esterase.   EKG: No ST depression or elevation. The patient has decreased heart rate in the 40s and is regular, and she has a prolonged QT of 475.  Chest x-ray: No consolidations.  CT of the head: No acute intracranial process. Positive chronic small basal ischemic disease.   ASSESSMENT AND PLAN: An 81 year old female with history of coronary artery disease, hypertension, hyperlipidemia, depression, Alzheimer's dementia, GERD, irritable  bowel syndrome, comes with altered mental status.   1. Altered mental status: Likely metabolic encephalopathy secondary to infection. Infection most likely coming from urinary tract infection.  2. Urinary tract infection: The patient has positive elevation of white blood cells on urine, altered mental status, leukocyte esterase and nitrites. She has  had a urinary tract infection in the past. At this moment since the patient is allergic to penicillin and sulfa, we are going to treat it with Cipro, monitor urine output, monitor results of urine culture. Blood cultures have been taken as well.  3. Dehydration:  The patient looks moderately dehydrated. IV fluids given 90 mL an hour normal saline.  Monitor her chloride as it has been elevated. The patient has a good kidney function for her age.   4. Bradycardia:  The patient was shown to have significant bradycardia.  The patient is taking a beta blocker which we are going to hold.  She is on Alzheimer's medications, although the 2 that she is taking, Namenda and Exelon, do not have side effects of bradycardia. She is not taking ropinirole.  I had a long conversation with the daughter as far as what we should do for her bradycardia.  She says that at this moment she is not going to pursue any major treatments like a pacemaker, for which if she becomes severely bradycardic we could use medications like atropine; but if that does not work she will be fine. She is not proceeding with more interventions.  5. Dementia:  The patient has significant history of dementia for which we are going to just monitor closely. At this moment, she is n.p.o. and we are not going to give her  medications to her; if she gets agitated, we can do medications IM or IV.  6. Irritable bowel syndrome:  The patient has chronic diarrhea apparently several times a day occasionally.  At this moment, there are no signs of significant infection. We are going to check C diff and white blood cells.  7. Coronary artery disease. We are holding her medications.  Since she has diarrhea, I am not going to give her any aspirin per rectum, but we are going to monitor this.  8. Hypertension:  Continue to monitor. At this moment her blood pressure is very well-controlled. We are going to add on hydralazine IV if necessary for systolics above 160.  9. Gastrointestinal prophylaxis:  PPI.  10. Deep vein thrombosis prophylaxis: With heparin subcutaneous.  11. Other medical problems seem to be stable. She has slightly decreased platelets of 147. This would not be a contraindication for chemoprophylaxis for her DVT, but we going to observe.  If they continue to drop, it is likely to be consumption due to infection.   CODE STATUS:  The patient is a DNR.   TIME SPENT: I spent about 45 minutes with  this admission.   ____________________________ Felipa Furnace, MD rsg:cb D: 12/29/2012 16:17:00 ET T: 12/29/2012 17:03:37 ET JOB#: [id number]  cc: Felipa Furnace, MD, <Dictator> Samrat Hayward Juanda Chance MD ELECTRONICALLY SIGNED 12/30/2012 22:46

## 2015-01-12 NOTE — Discharge Summary (Signed)
ADDENDUM  PATIENT NAME:  Tanya JesterHOOK, Silvana MR#:  [id number] DATE OF BIRTH:  [birth year]  DATE OF ADMISSION:  06/24/2013 DATE OF DISCHARGE:    The patient ran a fever up to 101. Blood cultures negative. C. diff negative, but did grow gram-negative rod, greater than 100,000 colonies. She was placed initially on IV Rocephin and clindamycin and then will be switched to p.o. Levaquin on discharge. She was afebrile for 24 hours prior to discharge and found to have a gram-negative UTI.  ____________________________ Danella PentonMark F. Daziah Hesler, MD mfm:aw D: 06/30/2013 07:48:53 ET T: 06/30/2013 07:58:01 ET JOB#: [id number]  cc: Danella PentonMark F. Dunbar Buras, MD, <Dictator> Sadao Weyer Sherlene ShamsF Cassandra Harbold MD ELECTRONICALLY SIGNED 06/30/2013 8:11

## 2015-01-12 NOTE — Discharge Summary (Signed)
PATIENT NAME:  Tanya Shields, [name] MR#:  [id number] DATE OF BIRTH:  1921/08/01   ADDENDUM   The patient is an 81 year old lady who was admitted with end-stage dementia and Staph epi UTI. The patient is currently afebrile.  The family questions whether or not she may have dental problems again. This will need to be worked up as an outpatient at the facility by Dr. Hyacinth MeekerMiller. The patient remains a NO CODE.    ____________________________ Letta PateJohn B. Danne HarborWalker III, MD jbw:dmm D: 01/07/2013 10:11:00 ET T: 01/07/2013 10:39:15 ET JOB#: [id number]  cc: Jonny RuizJohn B. Danne HarborWalker III, MD, <Dictator> Elmo PuttJOHN B WALKER III MD ELECTRONICALLY SIGNED 01/10/2013 21:39

## 2015-01-12 NOTE — Discharge Summary (Signed)
PATIENT NAME:  Tanya Shields, Tanya Shields MR#:  [id number] DATE OF BIRTH:  05/01/1921  DATE OF ADMISSION:  06/24/2013 DATE OF DISCHARGE:  06/28/2013  DISCHARGE DIAGNOSES: 1.  Hip fracture, anterior trochanteric. 2.  Symptomatic anemia.  3.  Urinary tract infection.  4.  Dementia, Alzheimer's, severe.   DISCHARGE MEDICATIONS:  Vitamin B12 1000 mcg daily, aspirin 81 mg daily, Prilosec OTC 20 mg daily, Lovenox 40 mg subcutaneous daily x 14 days, Tylenol 500 mg every 6 hours p.r.n. pain, ciprofloxacin 250 mg b.i.d. x 1 week.   REASON FOR ADMISSION: An 81 year old female presents with hip fracture and anemia. Please see H and P for history of present illness, past medical history, and physical exam.   HOSPITAL COURSE: The patient was admitted, underwent hip fracture surgery per Orthopedics. She did well. Her hemoglobin dropped from 12 to 7. She was transfuse with 1 unit PRBC.  It came back to 10.5. She was still running low-grade temps postop and empirically treated for UTI with p.o. Cipro. Overall mentally she did reasonably well despite her severe Alzheimer's. She is not to get any benzodiazepines nor narcotics nor tramadol for pain, only Tylenol for pain. Overall prognosis is guarded     ____________________________ Danella PentonMark F. Miller, MD mfm:dp D: 06/28/2013 08:36:00 ET T: 06/28/2013 09:45:52 ET JOB#: [id number]  cc: Danella PentonMark F. Miller, MD, <Dictator> MARK Sherlene ShamsF MILLER MD ELECTRONICALLY SIGNED 06/28/2013 19:32

## 2015-01-12 NOTE — Discharge Summary (Signed)
PATIENT NAME:  Tanya Shields, Magenta MR#:  [id number] DATE OF BIRTH:  08/26/1921  DATE OF ADMISSION:  12/29/2012 DATE OF DISCHARGE:  01/06/2013  DISCHARGE DIAGNOSES: 1.  Encephalopathy, secondary to Staphylococcus epidermidis urinary tract infection.  2.  End stage dementia, Alzheimer type.  3.  Vitamin B12 deficiency.  4.  Coronary artery disease, post myocardial infarction and stent.   DISCHARGE MEDICATIONS: Doxycycline 100 mg b.i.d. x 5 days, aspirin 81 mg daily, vitamin B12 1000 mcg daily, Celexa  20 mg daily, Tylenol 325 mg q. 4 p.r.n., ranitidine 150 mg b.i.d.   REASON FOR ADMISSION: An 81 year old female presented with altered mental status and fever. Please see History and Physical for past medical history, family history.   HOSPITAL COURSE: The patient was admitted and initially started on Rocephin. However, her urine culture grew out Staphylococcus epidermidis and she was switched to IV vancomycin. Her laboratory evaluation remained stable. Her IV fluids ultimately were discontinued. Her mental status did improve somewhat, not quite back to baseline but certainly better. She ran a temperature of 99 to 100. No clear etiology of that. No sign of drug rash. Possibly she had a bout of aspiration and cleared that. She will finish up 7 days of vancomycin IV and 5 more days of doxycycline. She will be going to skilled nursing ultimately[facility]   The family has requested discontinuing her Exelon and Namenda.     ____________________________ Danella PentonMark F. Miller, MD mfm:dm D: 01/06/2013 08:07:00 ET T: 01/06/2013 08:18:59 ET JOB#: [id number]  cc: Danella PentonMark F. Miller, MD, <Dictator> MARK Sherlene ShamsF MILLER MD ELECTRONICALLY SIGNED 01/10/2013 8:10

## 2015-01-12 NOTE — H&P (Signed)
PATIENT NAME:  Tanya Shields, Tanya Shields MR#:  [id number] DATE OF BIRTH:  Aug 18, 1921  DATE OF ADMISSION:  06/23/2013  PRIMARY CARE PHYSICIAN: Dr. Bethann PunchesMark Miller.   REFERRING PHYSICIAN: Dr. Lenard LancePaduchowski.   CHIEF COMPLAINT: Fall.   HISTORY OF PRESENT ILLNESS: The patient is an 81 year old Caucasian female with past medical history of Alzheimer's dementia, coronary artery disease, depression, hypertension, hyperlipidemia, GERD, irritable bowel syndrome and multiple intermittent episodes of diarrhea. Brought into the ER from assisted living facility after she sustained a fall. The patient has severe dementia, and I was unable to get any history from her. According to the daughter, as the patient got up from the dining table, she lost her balance and fell on the floor. The patient was brought into the ER, and CT head and neck were normal. Right hip x-ray has revealed a femoral neck fracture on the right side. Chest x-ray was normal. The hospitalist team is called to admit the patient regarding preop clearance. The patient has developed some maculopapular rash for the past 2 days, and she has a dermatology appointment tomorrow morning. They have tried Atarax with no significant improvement. The patient has a history of coronary artery disease, and she used to see Dr. Lady GaryFath as an outpatient.   REVIEW OF SYSTEMS: Unobtainable due to the patient's dementia. According to the daughter, the patient gets diarrhea on and off recently. Most of her medications were discontinued by primary care physician, and the patient is getting only aspirin, multivitamin and she started doing much better as reported by the patient's daughter.   PAST MEDICAL HISTORY: According to the old records, hyperlipidemia, coronary artery disease status post MI, hypertension, osteoarthritis, depression, GERD, Alzheimer's dementia, vitamin B12 deficiency, irritable bowel syndrome, history of ischemic bowel disorder, chronic diarrhea intermittently, syncope,  macular degeneration, fibroids.   PAST SURGICAL HISTORY: Right coronary artery stent, EGD, D and C x 2, appendectomy, total abdominal hysterectomy.   ALLERGIES: She is allergic to SULFA AND PENICILLIN.   PSYCHOSOCIAL HISTORY: The patient used to work at [employer] as a Psychologist, counsellingmicrobiologist. No history of smoking, alcohol or illicit drug usage. She is living in an assisted living facility.   FAMILY HISTORY: The patient's father had kidney disease. Mother had a CVA.   CODE STATUS: DNR.   HOME MEDICATIONS: Aspirin 81 mg once daily, vitamin B12 1000 mcg 1 tablet p.o. once daily, Prilosec over-the-counter p.o. once daily, hydroxyzine as needed for rash.   PHYSICAL EXAMINATION:  VITAL SIGNS: Temperature 98.8, pulse 75, respirations 18, blood pressure 124/48, pulse ox 96%.  GENERAL APPEARANCE: Not in any acute distress. Moderately built and nourished.  HEENT: Normocephalic. Pupils are equal and reactive to light and accommodation. No scleral icterus. No conjunctival injection. Chronic left facial droop from recent trauma. No sinus tenderness. Moist mucous membranes. No nasal discharge or lesions.  NECK: Supple and symmetric. No masses. No thyromegaly.  LUNGS: Good respiratory effort. Clear to auscultation bilaterally. No accessory muscle usage. No anterior chest wall tenderness on palpation.  CARDIAC: S1 and S2 normal. Regular rate and rhythm. No gallops, clicks or rubs. Point of maximum impulse is within normal limits.   GASTROINTESTINAL: Soft. Bowel sounds are positive in all 4 quadrants. No hepatosplenomegaly. No masses. Nontender, nondistended. No rebound tenderness.  GENITOURINARY: Deferred.  MUSCULOSKELETAL: No joint effusion. Positive right hip tenderness. Normal tone.  SKIN: Well-hydrated. Maculopapular rash is noticed throughout the body. No vesicles. No discharge.  VASCULAR: Good dorsalis pedis and posterior tibialis pulses.  NEUROLOGIC: The patient has chronic dementia.  Motor and  sensory could not be elicited. She is not cooperating and not following verbal commands.  PSYCHIATRIC: Mood and affect could not be elicited because of the patient's chronic dementia.   LABORATORIES AND IMAGING STUDIES: Chest x-ray normal. Right hip x-ray revealed femoral neck fracture. Right knee x-ray is normal. CT of the head: No acute intracranial findings. CT of the neck: No acute fracture. The remainder of the findings are described in the body of the report.   CBC and Chem-8 are pending. A 12-lead EKG has revealed normal sinus rhythm at 82 beats per minute. Normal PR and QRS interval. Nonspecific ST-T wave changes.   ASSESSMENT AND PLAN:  1. Right hip fracture following fall: Will keep her n.p.o. with p.o. meds. Provide intravenous fluids while the patient is n.p.o. Consult orthopedics, Dr. Rosita Kea. Pain management with morphine intravenous 1 mg only as needed basis. The patient is medically optimized for surgery. 2. Maculopapular rash: Will provide her Benadryl on as needed basis. OP follow up with  dermatology if no improvement. 3. History of dementia.  4. Hypertension: Blood pressure is stable at this time.  5. History of coronary artery disease: The patient is on baby aspirin which will be resumed soon after surgery.  6. Will provide her gastrointestinal prophylaxis.  7. Deep vein thrombosis prophylaxis with sequential compression devices.   Diagnosis and plan of care was discussed in detail with the patient's daughter at bedside.   CODE STATUS: DO NOT RESUSCITATE. Daughter is the medical power of attorney.   The patient will be transferred to Dr. Marlynn Perking service in a.m.   TOTAL TIME SPENT ON THE ADMISSION: 45 minutes.    ____________________________ Ramonita Lab, MD ag:gb D: 06/24/2013 00:40:38 ET T: 06/24/2013 01:41:21 ET JOB#: [id number]  cc: Ramonita Lab, MD, <Dictator> Danella Penton, MD Ramonita Lab MD ELECTRONICALLY SIGNED 06/24/2013 7:19

## 2015-01-12 NOTE — Op Note (Signed)
PATIENT NAME:  Tanya Shields, Tanya Shields MR#:  [id number] DATE OF BIRTH:  [birth year]  DATE OF PROCEDURE:  06/24/2013  PREOPERATIVE DIAGNOSIS:  Right femoral neck fracture and osteoarthritis.   POSTOPERATIVE DIAGNOSIS:   Right femoral neck fracture and osteoarthritis.   ANESTHESIA: Spinal.   DESCRIPTION OF PROCEDURE:  The patient was brought to the operating room, and after adequate anesthesia was obtained, the patient was transferred to the operative table with the right foot in the traction boot from Medacta attachment, left leg on a well-padded table. C-arm was brought in and good visualization of the hip could be obtained with measurements for subsequent comparison for implants. The hip was then prepped and draped using the usual sterile method. Appropriate patient identification and timeout procedures completed. A direct anterior approach was made with incision over the tensor fascia muscle. The subcutaneous tissue was spread, and the TFL was identified. The deep fascia incised, and then the lateral femoral circumflex vessels cauterized. The capsule was then opened, hematoma evacuated, and the femoral neck cut carried out. The fracture was subcapital, and the head was removed without much difficulty. There was significant degenerative change present. The acetabulum was reamed to 48 mm, and a 48 mm trial fit well. The cup was impacted into place with a Versafitcup. The cup from Medacta impacted, checking on C-arm for appropriate positioning. The leg was externally rotated, and the pubofemoral and ischiofemoral ligaments were released. The leg was dropped into extension and  adduction, and the proximal femur was opened with a box osteotome, followed by a curette. Sequential broaching was carried out to a size 2 that gave a good fill in the canal. With the S head and appropriate liner, the leg length appeared restored.  The trial components were removed, and the final components assembled. The 2 stem was impacted down  the canal to the appropriate level. The bipolar head assembled and impacted onto the stem. The hip was then reduced and was stable to 90-degree rotation test with final x-ray showing restoration of length. The wound was thoroughly irrigated. 30 mL of  0.5% Sensorcaine with epinephrine was infiltrated in the area of the capsule and muscle for postoperative analgesia. The tensor fascia was repaired using a heavy quill, 2-0 quill subcutaneously, and skin staples. Xeroform, 4 x 4's, ABD and tape applied.   ESTIMATED BLOOD LOSS: 400 mL.   COMPLICATIONS: None.   SPECIMEN: Removed femoral head and neck.   IMPLANTS: Medacta AMIS 2 femoral stem, 48 mm Versafitcup DM  with liner, and an S-28 mm head.   CONDITION:  To recovery room stable.       ____________________________ Leitha SchullerMichael J. Angelia Hazell, MD mjm:dmm D: 06/24/2013 17:30:40 ET T: 06/24/2013 21:32:44 ET JOB#: [id number]  cc: Leitha SchullerMichael J. Anaily Ashbaugh, MD, <Dictator> Leitha SchullerMICHAEL J Lotoya Casella MD ELECTRONICALLY SIGNED 06/25/2013 19:35

## 2015-01-12 NOTE — Consult Note (Signed)
Brief Consult Note: Diagnosis: right hip degenerative arthritis and femoral neck fracture.   Patient was seen by consultant.   Recommend to proceed with surgery or procedure.   Orders entered.   Comments: discussed risks, benefits, possible complications and plan on proceeding later today.  Electronic Signatures: Kamelia Lampkins JLeitha Schuller (MD)  (Signed 03-Oct-14 08:11)  Authored: Brief Consult Note   Last Updated: 03-Oct-14 08:11 by Leitha SchullerMenz, Reesa Gotschall J (MD)
